# Patient Record
Sex: Female | Born: 1975 | Race: White | Hispanic: No | Marital: Single | Smoking: Never smoker
Health system: Southern US, Community
[De-identification: ages and names within clinical notes are randomized; demographics above are authoritative.]

## PROBLEM LIST (undated history)

## (undated) DIAGNOSIS — D649 Anemia, unspecified: Secondary | ICD-10-CM

## (undated) DIAGNOSIS — F32A Depression, unspecified: Secondary | ICD-10-CM

## (undated) DIAGNOSIS — F329 Major depressive disorder, single episode, unspecified: Secondary | ICD-10-CM

## (undated) HISTORY — PX: WISDOM TOOTH EXTRACTION: SHX21

---

## 2008-07-25 ENCOUNTER — Encounter: Admission: RE | Admit: 2008-07-25 | Discharge: 2008-07-25 | Payer: Self-pay | Admitting: Obstetrics and Gynecology

## 2008-07-31 ENCOUNTER — Encounter: Admission: RE | Admit: 2008-07-31 | Discharge: 2008-07-31 | Payer: Self-pay | Admitting: Obstetrics and Gynecology

## 2009-07-16 IMAGING — MG MM DIAGNOSTIC LTD RIGHT
2 series · 2 of 2 positions shown · non-contrast
Comparison: None

CLINICAL DATA: The patient returns for evaluation of a possible
mass in the right breast noted on recent screening study dated
07/25/2008.  The patient's mother had breast cancer at age 39.

DIGITAL DIAGNOSTIC RIGHT LIMITED MAMMOGRAM ]

[R CC]
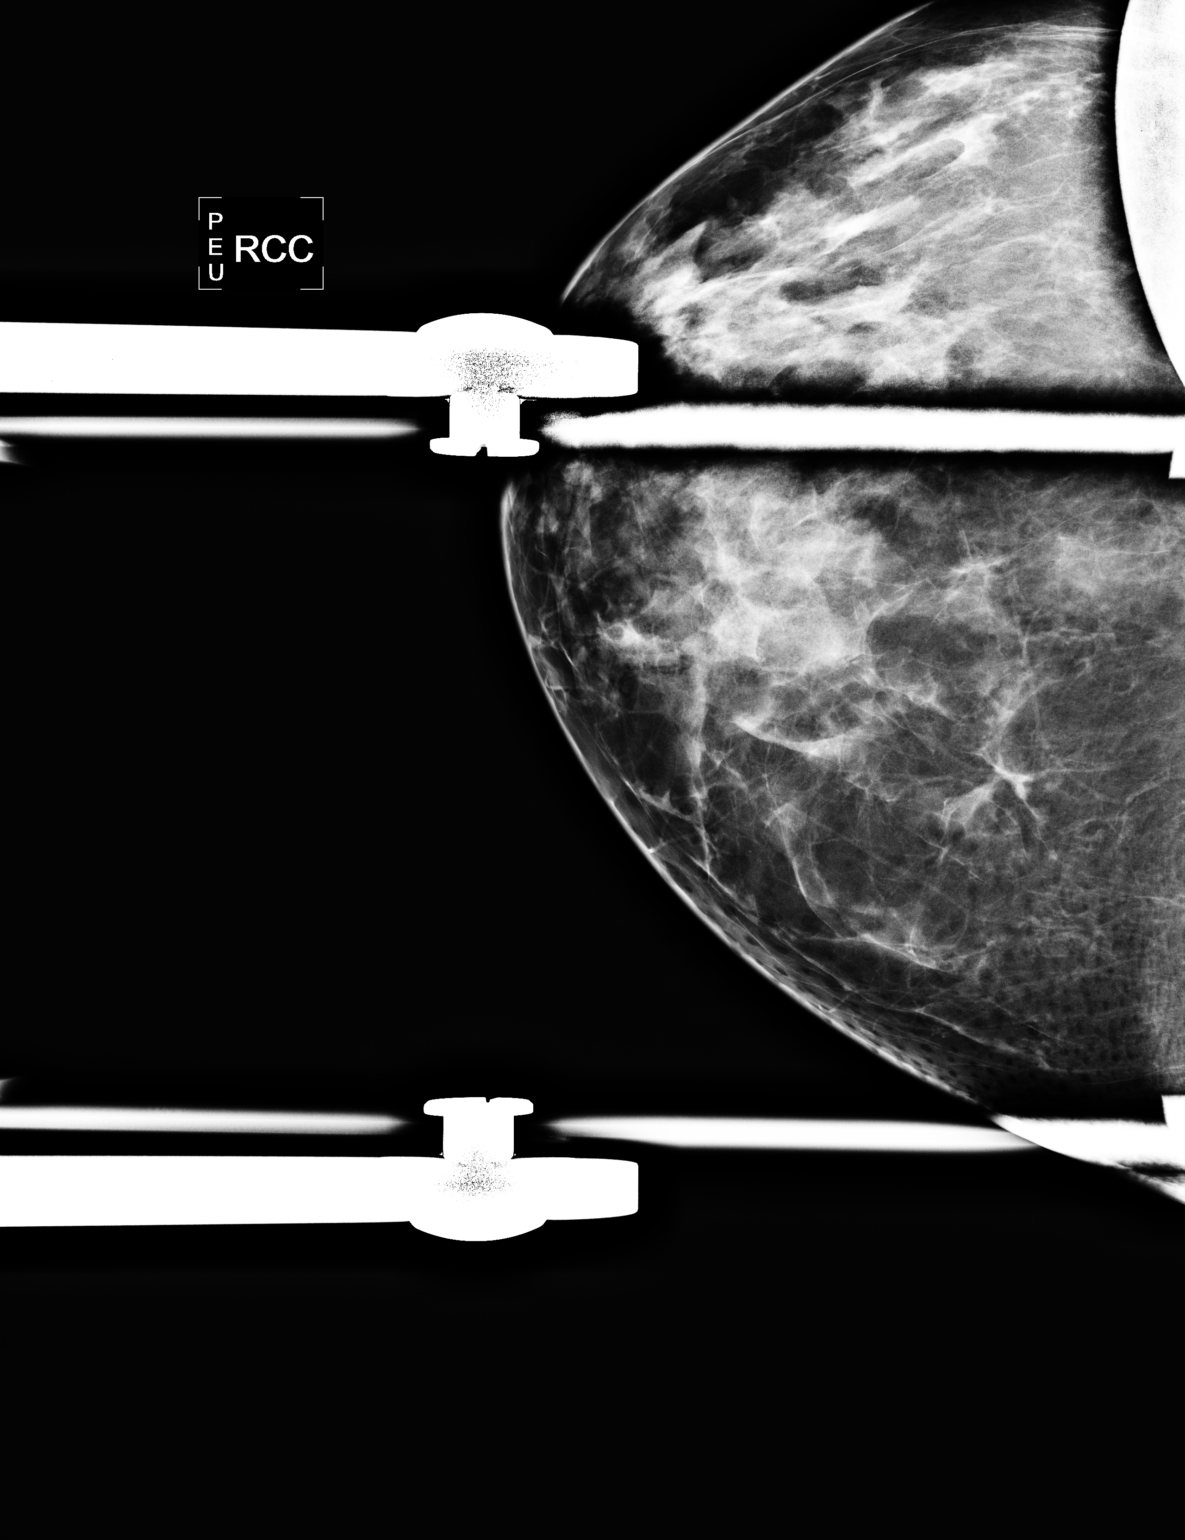

[R MLO]
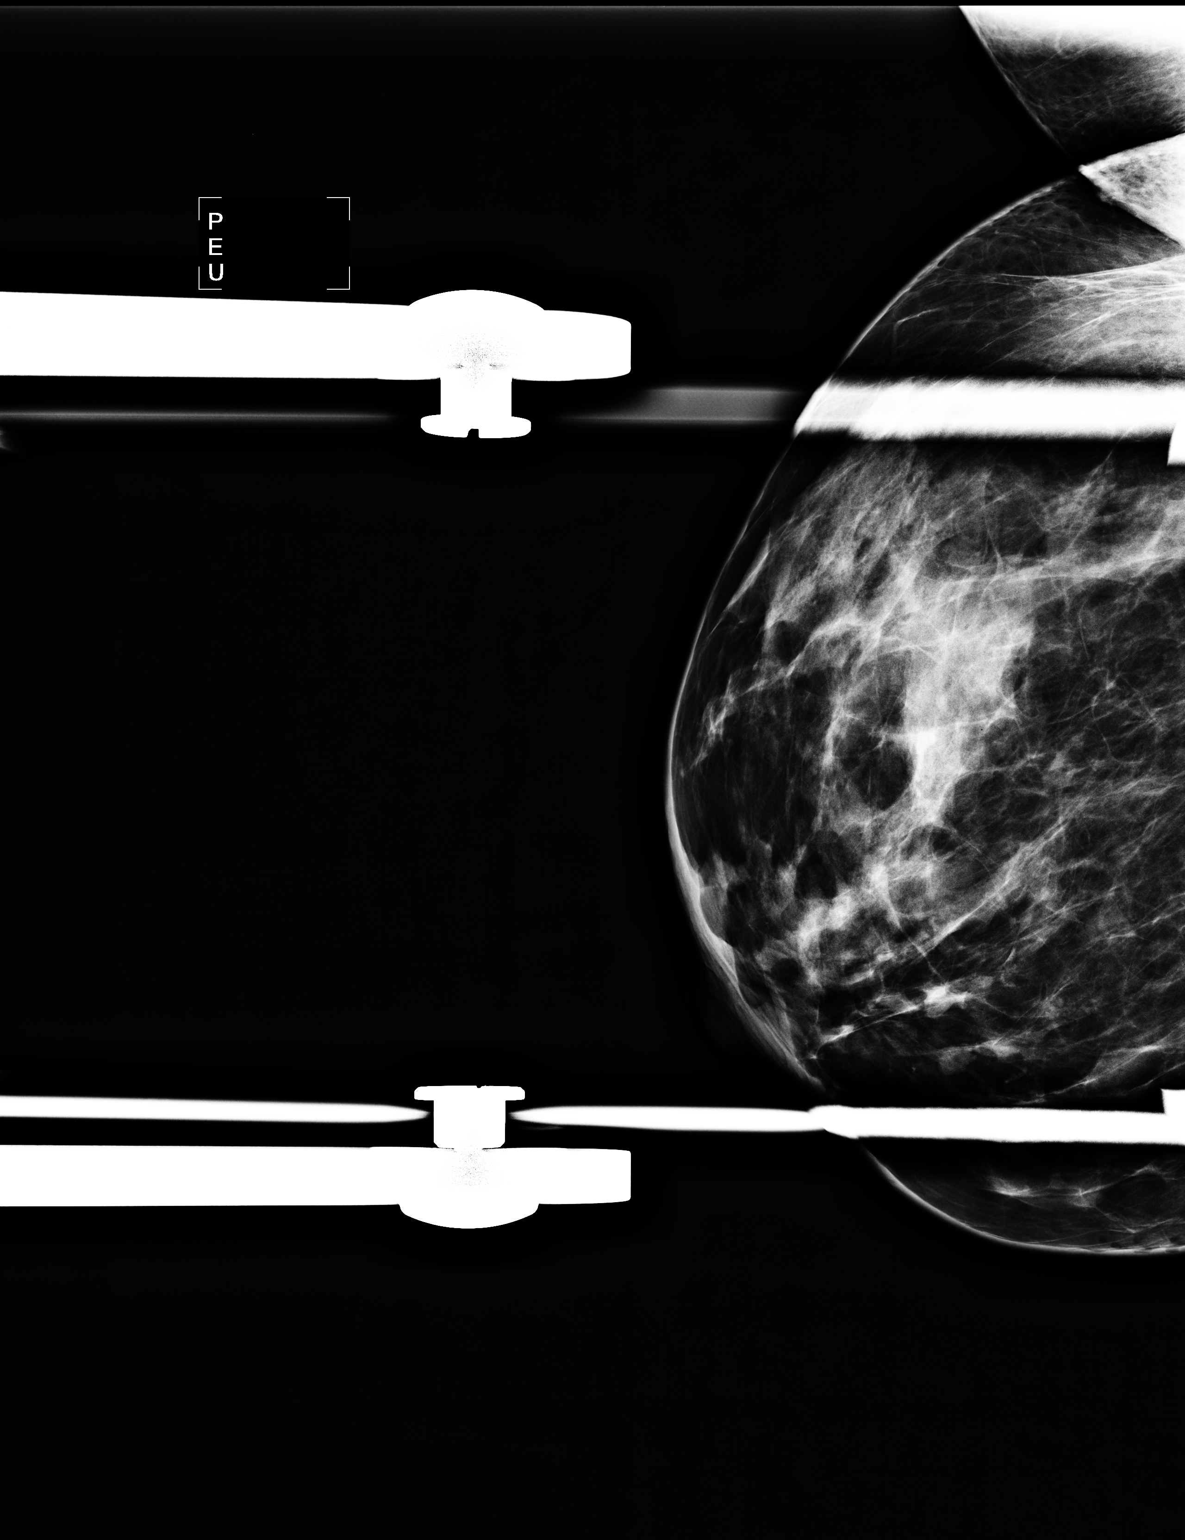

[2 of 2 positions shown; findings below may reference images not displayed]

FINDINGS: Additional views demonstrate no persistent mass or
distortion.
IMPRESSION: No persistent worrisome abnormality upon additional imaging of the
right breast.  Yearly screening mammography is suggested.

BI-RADS CATEGORY 1:  Negative.

## 2012-02-14 ENCOUNTER — Ambulatory Visit (INDEPENDENT_AMBULATORY_CARE_PROVIDER_SITE_OTHER): Payer: BC Managed Care – PPO | Admitting: Family Medicine

## 2012-02-14 VITALS — BP 113/77 | HR 79 | Temp 98.3°F | Resp 18 | Ht 71.5 in | Wt 238.0 lb

## 2012-02-14 DIAGNOSIS — J310 Chronic rhinitis: Secondary | ICD-10-CM

## 2012-02-14 DIAGNOSIS — R059 Cough, unspecified: Secondary | ICD-10-CM

## 2012-02-14 DIAGNOSIS — R05 Cough: Secondary | ICD-10-CM

## 2012-02-14 MED ORDER — BENZONATATE 100 MG PO CAPS: 100.0000 mg | ORAL_CAPSULE | Freq: Two times a day (BID) | ORAL | Status: AC | PRN

## 2012-02-14 MED ORDER — ALBUTEROL SULFATE HFA 108 (90 BASE) MCG/ACT IN AERS: 2.0000 | INHALATION_SPRAY | Freq: Four times a day (QID) | RESPIRATORY_TRACT | Status: AC | PRN

## 2012-02-14 MED ORDER — FLUTICASONE PROPIONATE 50 MCG/ACT NA SUSP: 2.0000 | Freq: Every day | NASAL | Status: AC

## 2012-02-14 NOTE — Progress Notes (Signed)
  [facility]:  Office Visit  Chief Complaint:  Chief Complaint  Patient presents with  . Cough    over a week, hard to exercise, walk up stairs    HPI: Joanna Leonard is a 36 y.o. female who complains of  Cough, non productive, hard to exercise, tried robitussion and nyquil without relief. NO wheeze.  NO asthma or allergies. No ear or sinus pressure. Pressure when she is running. No tobacco use.   History reviewed. No pertinent past medical history. History reviewed. No pertinent past surgical history. History   Social History  . Marital Status: Single    Spouse Name: N/A    Number of Children: N/A  . Years of Education: N/A   Social History Main Topics  . Smoking status: Never Smoker   . Smokeless tobacco: None  . Alcohol Use: No  . Drug Use: No  . Sexually Active: None   Other Topics Concern  . None   Social History Narrative  . None   Family History  Problem Relation Age of Onset  . Diabetes Mother   . Cancer Mother   . Diabetes Father   . Hyperlipidemia Father    Allergies no known allergies Prior to Admission medications   Not on File     ROS: The patient denies fevers, chills, night sweats, unintentional weight loss, chest pain, palpitations, wheezing, dyspnea on exertion, nausea, vomiting, abdominal pain, dysuria, hematuria, melena, numbness, weakness, or tingling. + cough  All other systems have been reviewed and were otherwise negative with the exception of those mentioned in the HPI and as above.    PHYSICAL EXAM: Filed Vitals:   02/14/12 1533  BP: 113/77  Pulse: 79  Temp: 98.3 F (36.8 C)  Resp: 18   Filed Vitals:   02/14/12 1533  Height: 5' 11.5" (1.816 m)  Weight: 238 lb (107.956 kg)   Body mass index is 32.73 kg/(m^2).  General: Alert, no acute distress HEENT:  Normocephalic, atraumatic, oropharynx patent. TM nl,+ red boggy nares, clear rhinorrhea, no sinus tenderenss. NO tonsillar exudates Cardiovascular:  Regular  rate and rhythm, no rubs murmurs or gallops.  No Carotid bruits, radial pulse intact. No pedal edema.  Respiratory: Clear to auscultation bilaterally.  No wheezes, rales, or rhonchi.  No cyanosis, no use of accessory musculature GI: No organomegaly, abdomen is soft and non-tender, positive bowel sounds.  No masses. Skin: No rashes. Neurologic: Facial musculature symmetric. Psychiatric: Patient is appropriate throughout our interaction. Lymphatic: No cervical lymphadenopathy Musculoskeletal: Gait intact.   LABS: No results found for this or any previous visit.   EKG/XRAY:   Primary read interpreted by Dr. Conley Rolls at [facility].   ASSESSMENT/PLAN: Encounter Diagnoses  Name Primary?  . Cough Yes  . Rhinitis     Most likely viral, sxs treatment for now, no Xray today, if sxs worsen ask pt to return for Xray 1. Flonase 2. Tessalon Perles 3. Albuterol INH prn  Recommended getting PCP for annual since there is a h/o breast cancer in family, mom dx at age 78 y/o .     Rockne Coons, DO 02/14/2012 3:47 PM

## 2012-07-20 ENCOUNTER — Other Ambulatory Visit (HOSPITAL_COMMUNITY)
Admission: RE | Admit: 2012-07-20 | Discharge: 2012-07-20 | Disposition: A | Discharge status: Home / Self Care | Payer: BC Managed Care – PPO | Source: Ambulatory Visit | Attending: Family Medicine | Admitting: Family Medicine

## 2012-07-20 DIAGNOSIS — Z124 Encounter for screening for malignant neoplasm of cervix: Secondary | ICD-10-CM | POA: Insufficient documentation

## 2012-07-20 DIAGNOSIS — Z1151 Encounter for screening for human papillomavirus (HPV): Secondary | ICD-10-CM | POA: Insufficient documentation

## 2012-07-21 ENCOUNTER — Other Ambulatory Visit: Payer: Self-pay | Admitting: Family Medicine

## 2012-07-21 DIAGNOSIS — N6323 Unspecified lump in the left breast, lower outer quadrant: Secondary | ICD-10-CM

## 2012-07-21 DIAGNOSIS — Z803 Family history of malignant neoplasm of breast: Secondary | ICD-10-CM

## 2012-07-26 ENCOUNTER — Ambulatory Visit
Admission: RE | Admit: 2012-07-26 | Discharge: 2012-07-26 | Disposition: A | Discharge status: Home / Self Care | Payer: BC Managed Care – PPO | Source: Ambulatory Visit | Attending: Family Medicine | Admitting: Family Medicine

## 2012-07-26 DIAGNOSIS — N6323 Unspecified lump in the left breast, lower outer quadrant: Secondary | ICD-10-CM

## 2012-07-26 DIAGNOSIS — Z803 Family history of malignant neoplasm of breast: Secondary | ICD-10-CM

## 2012-07-27 ENCOUNTER — Other Ambulatory Visit: Payer: Self-pay

## 2014-02-09 ENCOUNTER — Other Ambulatory Visit (HOSPITAL_COMMUNITY)
Admission: RE | Admit: 2014-02-09 | Discharge: 2014-02-09 | Disposition: A | Discharge status: Home / Self Care | Payer: BC Managed Care – PPO | Source: Ambulatory Visit | Attending: Obstetrics and Gynecology | Admitting: Obstetrics and Gynecology

## 2014-02-09 ENCOUNTER — Other Ambulatory Visit: Payer: Self-pay | Admitting: Obstetrics and Gynecology

## 2014-02-09 DIAGNOSIS — Z01419 Encounter for gynecological examination (general) (routine) without abnormal findings: Secondary | ICD-10-CM | POA: Insufficient documentation

## 2014-02-09 DIAGNOSIS — Z1151 Encounter for screening for human papillomavirus (HPV): Secondary | ICD-10-CM | POA: Insufficient documentation

## 2014-02-24 ENCOUNTER — Encounter (HOSPITAL_COMMUNITY): Payer: Self-pay | Admitting: *Deleted

## 2014-03-02 ENCOUNTER — Other Ambulatory Visit: Payer: Self-pay | Admitting: Obstetrics and Gynecology

## 2014-03-02 NOTE — H&P (Signed)
Subjective:  Chief Complaint(s):   Abnormal uterine  bleeding   HPI:  General 38 y/o presents for ultrasound results to evaluate abnormal uterine bleeding. She is not currently bleeding. She had a menses March 6th through march 11th. Prior to that she bled from January 17 until february 16. 1 week was heavy bleeding the other days were light. She is getting married April Eleventh.  Her ultrasound shows a 7.4 cm x 4.4 cm x 4.4 cm uterus. The endometrium is 1.3 cm with a 1.2 cm hyperechoic mass at the fundal wasll measuring 1.2 cm. + BF is noted within the mass . The right ovary has a simple cyst 3.6 cm tha tis avascular. The left ovary is normal.  Current Medication:  Taking  Prenatal Multivit-Iron Tablet as directed occ.     Lo/Ovral (28) 0.3-30 MG-MCG Tablet 1 tablet Once a day, Notes: not taking     Medication List reviewed and reconciled with the patient   Medical History:   2010-2011 h/o depression Did well on Wellbutrin.     h/o iron-deficiency anemia   Allergies/Intolerance:   N.K.D.A.   Gyn History:   Sexual activity not currently sexually active. Periods : regular usually. LMP 12/24/13 until 01/23/14. Birth control none. Last pap smear date 07/2012. Abnormal pap smear none.   OB History:   Never been pregnant per patient. Number of pregnancies 0.   Surgical History:   No Surgical History documented.   Hospitalization:   No Hospitalization History.   Family History:   Father: alive 69 yrs, diabetes, nueropathy, HTN, Sleep Apnea, diagnosed with DM, HTN    Mother: alive 55 yrs, Breast cancer x 2, diabetes, diagnosed with DM, Breast Ca    Brother 1: alive 36 yrs, sleep apnea,gout   Mother diagnosed with breast cancer initially age 18;reoccurance age 3.  Social History:  General Tobacco use cigarettes: Never smoked, Tobacco history last updated 02/09/2014.  no Smoking.  Alcohol: yes, Rare~1x per year.  Caffeine: yes.  no Recreational drug use.  no Diet.  Exercise: yes,  intermittent, does zumba.  Occupation: employed, Runner, broadcasting/film/video - math.  Education: yes, [employer].  Marital Status: single, Divorced/ now engaged - no date set yet.  Children: none.  ROS: Negative except as stated in HPI.   Objective:  Vitals:  Wt 267, Wt change -1 lb, Pulse sitting 66, BP sitting 105/60  Past Results:  Examination:  General Examination alert, oriented, NAD" label="GENERAL APPEARANCE" categoryPropId="[id number]" examid="[id number]"GENERAL APPEARANCE alert, oriented, NAD.  clear to auscultation bilaterally" label="LUNGS:" categoryPropId="87" examid="[id number]"LUNGS: clear to auscultation bilaterally.  regular rate and rhythm" label="HEART:" categoryPropId="86" examid="[id number]"HEART: regular rate and rhythm.  soft, non-tender/non-distended, bowel sounds present" label="ABDOMEN:" categoryPropId="88" examid="[id number]"ABDOMEN: soft, non-tender/non-distended, bowel sounds present.  normal external genitalia, labia - unremarkable, vagina - pink moist mucosa, no lesions or abnormal discharge, cervix - no discharge or lesions or CMT, adnexa - no masses or tenderness, uterus - nontender and normal size on palpation" label="FEMALE GENITOURINARY:" categoryPropId="[id number]" examid="[id number]"FEMALE GENITOURINARY: normal external genitalia, labia - unremarkable, vagina - pink moist mucosa, no lesions or abnormal discharge, cervix - no discharge or lesions or CMT, adnexa - no masses or tenderness, uterus - nontender and normal size on palpation.  no edema present" label="EXTREMITIES:" categoryPropId="89" examid="[id number]"EXTREMITIES: no edema present.  Physical Examination:   Assessment:  Assessment:  Abnormal uterine bleeding - 626.9 (Primary)     Endometrial polyp - 621.0     Plan:  Treatment:  Abnormal uterine bleeding  Imaging:US ECHO TRANSVAGINAL  Notes: u/s results discussed  with the patient..recommend hysteroscopy D&C with possible polypectomy... r/b/a of surgery discussed with patient including but  not limited to infection bleeding uterine perforation with the need for furgery surgery .. pt voiced understanding and is in agreement with proceeding with surgery.  Referral ZO:XWRUTo:Jvion Turgeon OB - Gynecology  Reason:check insurance coverage of hysteroscopy D&C polypectomy  Endometrial polyp  Notes: u/s results discussed with the patient..recommend hysteroscopy D&C with possible polypectomy... r/b/a of surgery discussed with patient including but not limited to infection bleeding uterine perforation with the need for furgery surgery .. pt voiced understanding and is in agreement with proceeding with surgery.  Procedures:  Immunizations:  Therapeutic Injections:  Diagnostic Imaging:  Lab Reports:  Preventive Medicine:    Next Appointment:   3 Weeks (Reason: postoperative visit )

## 2014-03-03 ENCOUNTER — Encounter (HOSPITAL_COMMUNITY): Payer: Self-pay | Admitting: Anesthesiology

## 2014-03-03 ENCOUNTER — Ambulatory Visit (HOSPITAL_COMMUNITY)
Admission: RE | Admit: 2014-03-03 | Discharge: 2014-03-03 | Disposition: A | Discharge status: Home / Self Care | Payer: BC Managed Care – PPO | Source: Ambulatory Visit | Attending: Obstetrics and Gynecology | Admitting: Obstetrics and Gynecology

## 2014-03-03 ENCOUNTER — Ambulatory Visit (HOSPITAL_COMMUNITY): Payer: BC Managed Care – PPO | Admitting: Anesthesiology

## 2014-03-03 ENCOUNTER — Encounter (HOSPITAL_COMMUNITY): Payer: BC Managed Care – PPO | Admitting: Anesthesiology

## 2014-03-03 ENCOUNTER — Encounter (HOSPITAL_COMMUNITY)
Admission: RE | Disposition: A | Discharge status: Home / Self Care | Payer: Self-pay | Source: Ambulatory Visit | Attending: Obstetrics and Gynecology

## 2014-03-03 DIAGNOSIS — N938 Other specified abnormal uterine and vaginal bleeding: Secondary | ICD-10-CM | POA: Insufficient documentation

## 2014-03-03 DIAGNOSIS — N840 Polyp of corpus uteri: Secondary | ICD-10-CM

## 2014-03-03 DIAGNOSIS — Q513 Bicornate uterus: Secondary | ICD-10-CM

## 2014-03-03 DIAGNOSIS — N949 Unspecified condition associated with female genital organs and menstrual cycle: Secondary | ICD-10-CM | POA: Insufficient documentation

## 2014-03-03 HISTORY — PX: HYSTEROSCOPY W/D&C: SHX1775

## 2014-03-03 HISTORY — DX: Major depressive disorder, single episode, unspecified: F32.9

## 2014-03-03 HISTORY — DX: Depression, unspecified: F32.A

## 2014-03-03 HISTORY — DX: Anemia, unspecified: D64.9

## 2014-03-03 LAB — PREGNANCY, URINE: PREG TEST UR: NEGATIVE

## 2014-03-03 LAB — CBC
HCT: 38.9 % (ref 36.0–46.0)
Hemoglobin: 13.1 g/dL (ref 12.0–15.0)
MCH: 28.9 pg (ref 26.0–34.0)
MCHC: 33.7 g/dL (ref 30.0–36.0)
MCV: 85.7 fL (ref 78.0–100.0)
Platelets: 303 10*3/uL (ref 150–400)
RBC: 4.54 MIL/uL (ref 3.87–5.11)
RDW: 13.5 % (ref 11.5–15.5)
WBC: 5.4 10*3/uL (ref 4.0–10.5)

## 2014-03-03 SURGERY — DILATATION AND CURETTAGE /HYSTEROSCOPY
Anesthesia: General | Site: Uterus

## 2014-03-03 MED ORDER — LACTATED RINGERS IV SOLN
INTRAVENOUS | Status: DC
  Administered 2014-03-03 (×2): via INTRAVENOUS

## 2014-03-03 MED ORDER — MIDAZOLAM HCL 2 MG/2ML IJ SOLN
INTRAMUSCULAR | Status: DC | PRN
  Administered 2014-03-03: 2 mg via INTRAVENOUS

## 2014-03-03 MED ORDER — SILVER NITRATE-POT NITRATE 75-25 % EX MISC
CUTANEOUS | Status: DC | PRN
  Administered 2014-03-03: 2

## 2014-03-03 MED ORDER — ONDANSETRON HCL 4 MG/2ML IJ SOLN: 4.0000 mg | Freq: Once | INTRAMUSCULAR | Status: DC | PRN

## 2014-03-03 MED ORDER — FENTANYL CITRATE 0.05 MG/ML IJ SOLN
INTRAMUSCULAR | Status: DC | PRN
  Administered 2014-03-03 (×2): 50 ug via INTRAVENOUS

## 2014-03-03 MED ORDER — PHENYLEPHRINE HCL 10 MG/ML IJ SOLN
INTRAMUSCULAR | Status: AC
  Filled 2014-03-03: qty 1

## 2014-03-03 MED ORDER — SILVER NITRATE-POT NITRATE 75-25 % EX MISC
CUTANEOUS | Status: AC
  Filled 2014-03-03: qty 1

## 2014-03-03 MED ORDER — KETOROLAC TROMETHAMINE 30 MG/ML IJ SOLN
INTRAMUSCULAR | Status: AC
  Filled 2014-03-03: qty 1

## 2014-03-03 MED ORDER — GLYCINE 1.5 % IR SOLN
Status: DC | PRN
  Administered 2014-03-03: 3000 mL

## 2014-03-03 MED ORDER — KETOROLAC TROMETHAMINE 30 MG/ML IJ SOLN: 15.0000 mg | Freq: Once | INTRAMUSCULAR | Status: DC | PRN

## 2014-03-03 MED ORDER — MEPERIDINE HCL 25 MG/ML IJ SOLN: 6.2500 mg | INTRAMUSCULAR | Status: DC | PRN

## 2014-03-03 MED ORDER — FENTANYL CITRATE 0.05 MG/ML IJ SOLN
INTRAMUSCULAR | Status: AC
  Filled 2014-03-03: qty 2

## 2014-03-03 MED ORDER — PROPOFOL 10 MG/ML IV EMUL
INTRAVENOUS | Status: AC
  Filled 2014-03-03: qty 20

## 2014-03-03 MED ORDER — KETOROLAC TROMETHAMINE 30 MG/ML IJ SOLN
INTRAMUSCULAR | Status: DC | PRN
  Administered 2014-03-03: 30 mg via INTRAVENOUS

## 2014-03-03 MED ORDER — DEXAMETHASONE SODIUM PHOSPHATE 10 MG/ML IJ SOLN
INTRAMUSCULAR | Status: AC
  Filled 2014-03-03: qty 1

## 2014-03-03 MED ORDER — FENTANYL CITRATE 0.05 MG/ML IJ SOLN: 25.0000 ug | INTRAMUSCULAR | Status: DC | PRN

## 2014-03-03 MED ORDER — BUPIVACAINE HCL (PF) 0.25 % IJ SOLN
INTRAMUSCULAR | Status: DC | PRN
  Administered 2014-03-03: 20 mL

## 2014-03-03 MED ORDER — MIDAZOLAM HCL 2 MG/2ML IJ SOLN
INTRAMUSCULAR | Status: AC
  Filled 2014-03-03: qty 2

## 2014-03-03 MED ORDER — IBUPROFEN 800 MG PO TABS
800.0000 mg | ORAL_TABLET | Freq: Three times a day (TID) | ORAL | Status: AC | PRN
Start: 2014-03-03 — End: ?

## 2014-03-03 MED ORDER — LIDOCAINE HCL (CARDIAC) 20 MG/ML IV SOLN
INTRAVENOUS | Status: DC | PRN
  Administered 2014-03-03: 80 mg via INTRAVENOUS

## 2014-03-03 MED ORDER — BUPIVACAINE HCL (PF) 0.25 % IJ SOLN
INTRAMUSCULAR | Status: AC
  Filled 2014-03-03: qty 10

## 2014-03-03 MED ORDER — LACTATED RINGERS IV SOLN: INTRAVENOUS | Status: DC

## 2014-03-03 MED ORDER — DEXAMETHASONE SODIUM PHOSPHATE 4 MG/ML IJ SOLN
INTRAMUSCULAR | Status: DC | PRN
  Administered 2014-03-03: 10 mg via INTRAVENOUS

## 2014-03-03 MED ORDER — ONDANSETRON HCL 4 MG/2ML IJ SOLN
INTRAMUSCULAR | Status: DC | PRN
  Administered 2014-03-03: 4 mg via INTRAVENOUS

## 2014-03-03 MED ORDER — LIDOCAINE HCL (CARDIAC) 20 MG/ML IV SOLN
INTRAVENOUS | Status: AC
  Filled 2014-03-03: qty 5

## 2014-03-03 MED ORDER — DOXYCYCLINE HYCLATE 50 MG PO CAPS: 100.0000 mg | ORAL_CAPSULE | Freq: Every day | ORAL | Status: AC

## 2014-03-03 MED ORDER — ONDANSETRON HCL 4 MG/2ML IJ SOLN
INTRAMUSCULAR | Status: AC
  Filled 2014-03-03: qty 2

## 2014-03-03 MED ORDER — PROPOFOL 10 MG/ML IV BOLUS
INTRAVENOUS | Status: DC | PRN
  Administered 2014-03-03: 200 mg via INTRAVENOUS

## 2014-03-03 SURGICAL SUPPLY — 21 items
CANISTER SUCT 3000ML (MISCELLANEOUS) ×3 IMPLANT
CATH ROBINSON RED A/P 16FR (CATHETERS) ×3 IMPLANT
CLOTH BEACON ORANGE TIMEOUT ST (SAFETY) ×3 IMPLANT
CONTAINER PREFILL 10% NBF 60ML (FORM) ×6 IMPLANT
DRAPE HYSTEROSCOPY (DRAPE) ×3 IMPLANT
DRSG TELFA 3X8 NADH (GAUZE/BANDAGES/DRESSINGS) ×3 IMPLANT
ELECT REM PT RETURN 9FT ADLT (ELECTROSURGICAL) ×3
ELECTRODE REM PT RTRN 9FT ADLT (ELECTROSURGICAL) ×1 IMPLANT
GLOVE BIOGEL M 6.5 STRL (GLOVE) ×6 IMPLANT
GLOVE BIOGEL PI IND STRL 6.5 (GLOVE) ×1 IMPLANT
GLOVE BIOGEL PI INDICATOR 6.5 (GLOVE) ×2
GOWN STRL REUS W/TWL LRG LVL3 (GOWN DISPOSABLE) ×6 IMPLANT
LOOP ANGLED CUTTING 22FR (CUTTING LOOP) ×3 IMPLANT
NEEDLE SPNL 22GX3.5 QUINCKE BK (NEEDLE) ×3 IMPLANT
PACK VAGINAL MINOR WOMEN LF (CUSTOM PROCEDURE TRAY) ×3 IMPLANT
PAD OB MATERNITY 4.3X12.25 (PERSONAL CARE ITEMS) ×3 IMPLANT
SET TUBING HYSTEROSCOPY 2 NDL (TUBING) ×3 IMPLANT
SYR CONTROL 10ML LL (SYRINGE) ×3 IMPLANT
TOWEL OR 17X24 6PK STRL BLUE (TOWEL DISPOSABLE) ×6 IMPLANT
TUBE HYSTEROSCOPY W Y-CONNECT (TUBING) IMPLANT
WATER STERILE IRR 1000ML POUR (IV SOLUTION) ×3 IMPLANT

## 2014-03-03 NOTE — H&P (Signed)
Date of Initial H&P: 03/02/2014 History reviewed, patient examined, no change in status, stable for surgery.

## 2014-03-03 NOTE — Anesthesia Postprocedure Evaluation (Signed)
Anesthesia Post Note  Patient: Joanna Leonard  Procedure(s) Performed: Procedure(s) (LRB): DILATATION AND CURETTAGE /HYSTEROSCOPY (N/A)  Anesthesia type: General  Patient location: PACU  Post pain: Pain level controlled  Post assessment: Post-op Vital signs reviewed  Last Vitals:  Filed Vitals:   03/03/14 1345  BP:   Pulse: 54  Temp:   Resp: 20    Post vital signs: Reviewed  Level of consciousness: sedated  Complications: No apparent anesthesia complications

## 2014-03-03 NOTE — Preoperative (Signed)
Beta Blockers   Reason not to administer Beta Blockers:Not Applicable 

## 2014-03-03 NOTE — Anesthesia Preprocedure Evaluation (Signed)
Anesthesia Evaluation  Patient identified by MRN, date of birth, ID band Patient awake    Reviewed: Allergy & Precautions, H&P , NPO status , Patient's Chart, lab work & pertinent test results  Airway Mallampati: II TM Distance: >3 FB Neck ROM: full    Dental no notable dental hx. (+) Teeth Intact   Pulmonary neg pulmonary ROS,    Pulmonary exam normal       Cardiovascular negative cardio ROS      Neuro/Psych negative neurological ROS     GI/Hepatic negative GI ROS, Neg liver ROS,   Endo/Other  negative endocrine ROS  Renal/GU negative Renal ROS     Musculoskeletal   Abdominal (+) + obese,   Peds  Hematology   Anesthesia Other Findings   Reproductive/Obstetrics negative OB ROS                           Anesthesia Physical Anesthesia Plan  ASA: II  Anesthesia Plan: General   Post-op Pain Management:    Induction: Intravenous  Airway Management Planned: LMA  Additional Equipment:   Intra-op Plan:   Post-operative Plan:   Informed Consent: I have reviewed the patients History and Physical, chart, labs and discussed the procedure including the risks, benefits and alternatives for the proposed anesthesia with the patient or authorized representative who has indicated his/her understanding and acceptance.     Plan Discussed with: CRNA and Surgeon  Anesthesia Plan Comments:         Anesthesia Quick Evaluation

## 2014-03-03 NOTE — Transfer of Care (Signed)
Immediate Anesthesia Transfer of Care Note  Patient: Joanna Leonard  Procedure(s) Performed: Procedure(s): DILATATION AND CURETTAGE /HYSTEROSCOPY (N/A)  Patient Location: PACU  Anesthesia Type:General  Level of Consciousness: awake, alert  and oriented  Airway & Oxygen Therapy: Patient Spontanous Breathing and Patient connected to nasal cannula oxygen  Post-op Assessment: Report given to PACU RN, Post -op Vital signs reviewed and stable and Patient moving all extremities  Post vital signs: Reviewed and stable  Complications: No apparent anesthesia complications

## 2014-03-03 NOTE — Discharge Instructions (Addendum)
DISCHARGE INSTRUCTIONS: D&C / HYSTEROSCOPY The following instructions have been prepared to help you care for yourself upon your return home.  **No Ibuprofen containing medications(Aleve, Motrin, Advil) until after 7:04 pm tonight**  Personal hygiene:  Use sanitary pads for vaginal drainage, not tampons.  Shower the day after your procedure.  NO tub baths, pools or Jacuzzis for 2-3 weeks.  Wipe front to back after using the bathroom.  Activity and limitations:  Do NOT drive or operate any equipment for 24 hours. The effects of anesthesia are still present and drowsiness may result.  Do NOT rest in bed all day.  Walking is encouraged.  Walk up and down stairs slowly.  You may resume your normal activity in one to two days or as indicated by your physician.  Sexual activity: NO intercourse for at least 2 weeks after the procedure, or as indicated by your Doctor.  Diet: Eat a light meal as desired this evening. You may resume your usual diet tomorrow.  Return to Work: You may resume your work activities in one to two days or as indicated by Therapist, sportsyour Doctor.  What to expect after your surgery: Expect to have vaginal bleeding/discharge for 2-3 days and spotting for up to 10 days. It is not unusual to have soreness for up to 1-2 weeks. You may have a slight burning sensation when you urinate for the first day. Mild cramps may continue for a couple of days. You may have a regular period in 2-6 weeks.  Call your doctor for any of the following:  Excessive vaginal bleeding or clotting, saturating and changing one pad every hour.  Inability to urinate 6 hours after discharge from hospital.  Pain not relieved by pain medication.  Fever of 100.4 F or greater.  Unusual vaginal discharge or odor.  Return to office _________________Call for an appointment ___________________  Patients signature: ______________________  Nurses signature ________________________  Support  person's signature___________________________  Post Anesthesia Care Unit [phone]

## 2014-03-03 NOTE — Op Note (Signed)
03/03/2014  1:30 PM  PATIENT:  Joanna Leonard  38 y.o. female  PRE-OPERATIVE DIAGNOSIS:  Abnormal Uterine Bleeding  POST-OPERATIVE DIAGNOSIS:  Abnormal Uterine Bleeding  PROCEDURE:  Procedure(s): DILATATION AND CURETTAGE /HYSTEROSCOPY (N/A)  SURGEON:  Surgeon(s) and Role:    * Danyia Borunda J. Richardson Doppole, MD - Primary  PHYSICIAN ASSISTANT:   ASSISTANTS: none   ANESTHESIA:   general  EBL:  Total I/O In: 1500 [I.V.:1500] Out: 70 [Urine:50; Blood:20]  BLOOD ADMINISTERED:none  DRAINS: none   LOCAL MEDICATIONS USED:  MARCAINE     SPECIMEN:  Source of Specimen:  endometrial currettings  and suspected polyp  DISPOSITION OF SPECIMEN:  PATHOLOGY  COUNTS:  YES  TOURNIQUET:  * No tourniquets in log *  DICTATION: .Dragon Dictation  PLAN OF CARE: Discharge to home after PACU  PATIENT DISPOSITION:  PACU - hemodynamically stable.  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 Endometrial polyp in dominate horn... Proliferative endometrium    Delay start of Pharmacological VTE agent (>24hrs) due to surgical blood loss or risk of bleeding: not applicable  Procedure: Patient was taken to the operating room where she was placed under general anesthesia. She was placed in the dorsal lithotomy position. She was prepped and draped in the usual sterile fashion. A speculum was placed into the vaginal vault. The anterior lip of the cervix was grasped with a single-tooth tenaculum. Quarter percent Marcaine was injected at the 4 and 8:00 positions of the cervix. The uterus was then sounded to 8 cm. The cervix was dilated to approximately 4 mm. Diagnostic hysteroscope was inserted. The findings noted above. The hysteroscope was removed. The cervix was dilated to 10 mm and the operative hysteroscope was inserted. The polyp was resected with loop electrode. The resectoscope was removed. A Sharp curet was introduced and endometrial curettings were obtained. The hysteroscope was then reinserted. There was no evidence of  endometrial polyps or masses with reinsertion of the hysteroscope. There was no evidence of perforation. Hysteroscope was then removed. The single-tooth tenaculum was removed from the anterior lip of the cervix.  Bleeding was noted from the tenaculum site.. Silver nitrate was applied.  The speculum was removed from the patient's vagina. She was awakened from anesthesia taken care to the recovery room awake and in stable condition. Sponge lap and needle counts were correct x2.  Glycine deficit was 25cc.

## 2014-03-06 ENCOUNTER — Encounter (HOSPITAL_COMMUNITY): Payer: Self-pay | Admitting: Obstetrics and Gynecology

## 2015-03-06 ENCOUNTER — Other Ambulatory Visit (HOSPITAL_COMMUNITY)
Admission: RE | Admit: 2015-03-06 | Discharge: 2015-03-06 | Disposition: A | Discharge status: Home / Self Care | Payer: BC Managed Care – PPO | Source: Ambulatory Visit | Attending: Obstetrics and Gynecology | Admitting: Obstetrics and Gynecology

## 2015-03-06 ENCOUNTER — Other Ambulatory Visit: Payer: Self-pay | Admitting: Obstetrics and Gynecology

## 2015-03-06 DIAGNOSIS — Z01419 Encounter for gynecological examination (general) (routine) without abnormal findings: Secondary | ICD-10-CM | POA: Insufficient documentation

## 2015-03-08 LAB — CYTOLOGY - PAP

## 2016-04-01 ENCOUNTER — Other Ambulatory Visit: Payer: Self-pay | Admitting: Obstetrics and Gynecology

## 2016-04-01 ENCOUNTER — Other Ambulatory Visit (HOSPITAL_COMMUNITY)
Admission: RE | Admit: 2016-04-01 | Discharge: 2016-04-01 | Disposition: A | Discharge status: Home / Self Care | Payer: BC Managed Care – PPO | Source: Ambulatory Visit | Attending: Obstetrics and Gynecology | Admitting: Obstetrics and Gynecology

## 2016-04-01 DIAGNOSIS — Z01419 Encounter for gynecological examination (general) (routine) without abnormal findings: Secondary | ICD-10-CM | POA: Insufficient documentation

## 2016-04-02 LAB — CYTOLOGY - PAP

## 2017-03-13 ENCOUNTER — Other Ambulatory Visit: Payer: Self-pay | Admitting: Obstetrics and Gynecology

## 2017-03-13 DIAGNOSIS — Z1231 Encounter for screening mammogram for malignant neoplasm of breast: Secondary | ICD-10-CM

## 2017-04-06 ENCOUNTER — Ambulatory Visit: Payer: Self-pay

## 2018-09-01 ENCOUNTER — Other Ambulatory Visit: Payer: Self-pay | Admitting: Obstetrics & Gynecology

## 2018-09-01 DIAGNOSIS — R928 Other abnormal and inconclusive findings on diagnostic imaging of breast: Secondary | ICD-10-CM

## 2018-09-07 ENCOUNTER — Ambulatory Visit
Admission: RE | Admit: 2018-09-07 | Discharge: 2018-09-07 | Disposition: A | Discharge status: Home / Self Care | Payer: BC Managed Care – PPO | Source: Ambulatory Visit | Attending: Obstetrics & Gynecology | Admitting: Obstetrics & Gynecology

## 2018-09-07 ENCOUNTER — Ambulatory Visit: Payer: Self-pay

## 2018-09-07 DIAGNOSIS — R928 Other abnormal and inconclusive findings on diagnostic imaging of breast: Secondary | ICD-10-CM

## 2019-08-23 IMAGING — MG DIGITAL DIAGNOSTIC UNILATERAL LEFT MAMMOGRAM WITH TOMO AND CAD
8 series · 8 of 24 positions shown · non-contrast
Comparison: Previous exam(s).

CLINICAL DATA: Patient recalled from screening for possible left
breast distortion.

EXAM:
DIGITAL DIAGNOSTIC UNILATERAL LEFT MAMMOGRAM WITH CAD AND TOMO

[L CC synth-2D (1 of 2)]
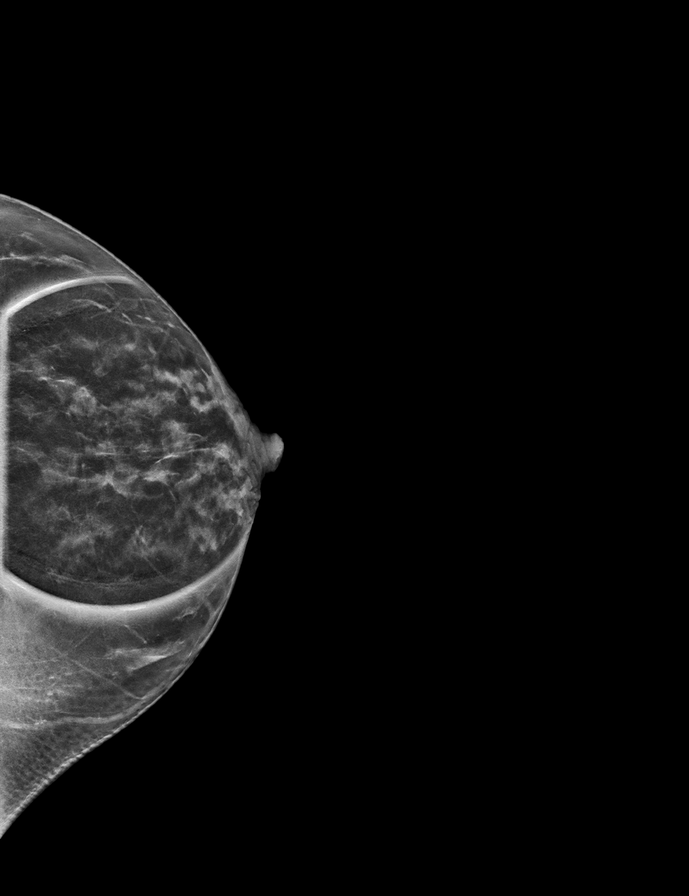

[L CC synth-2D (2 of 2)]
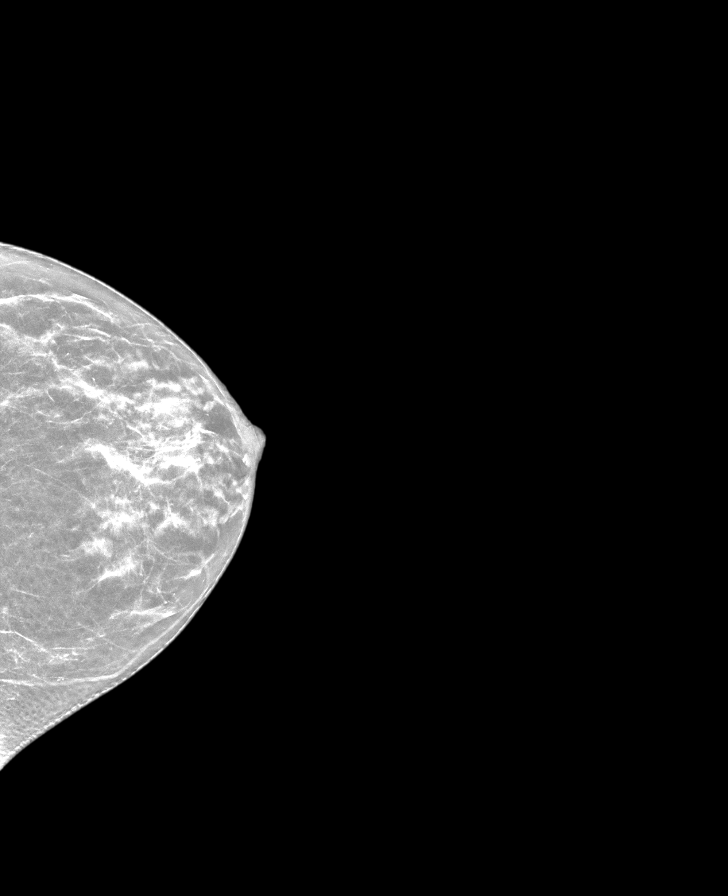

[L MLO synth-2D]
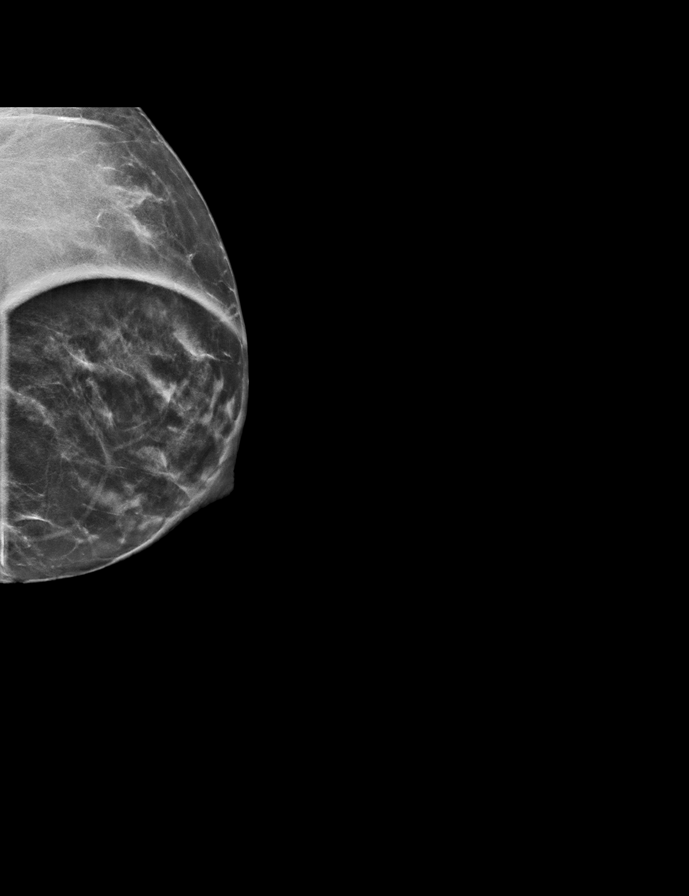

[L ML synth-2D]
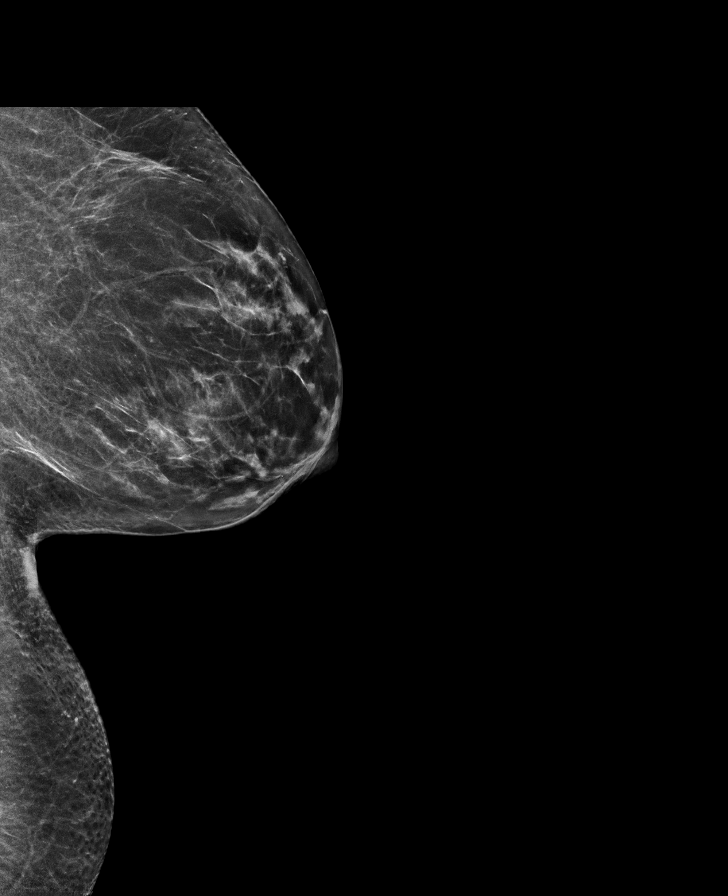

[L MLO tomo · tomo slice 26/51.0]
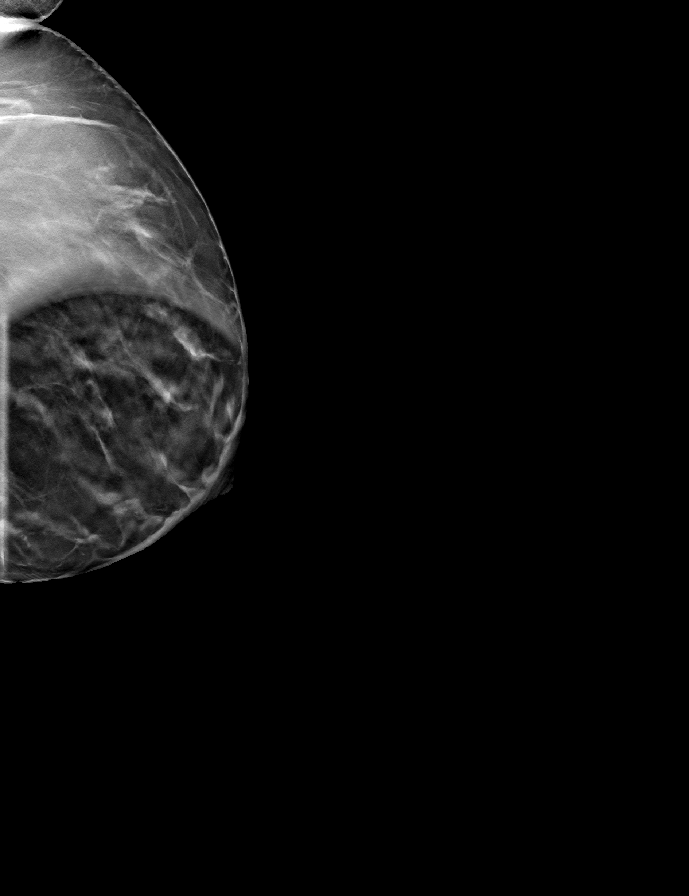

[L ML tomo · tomo slice 33/66.0]
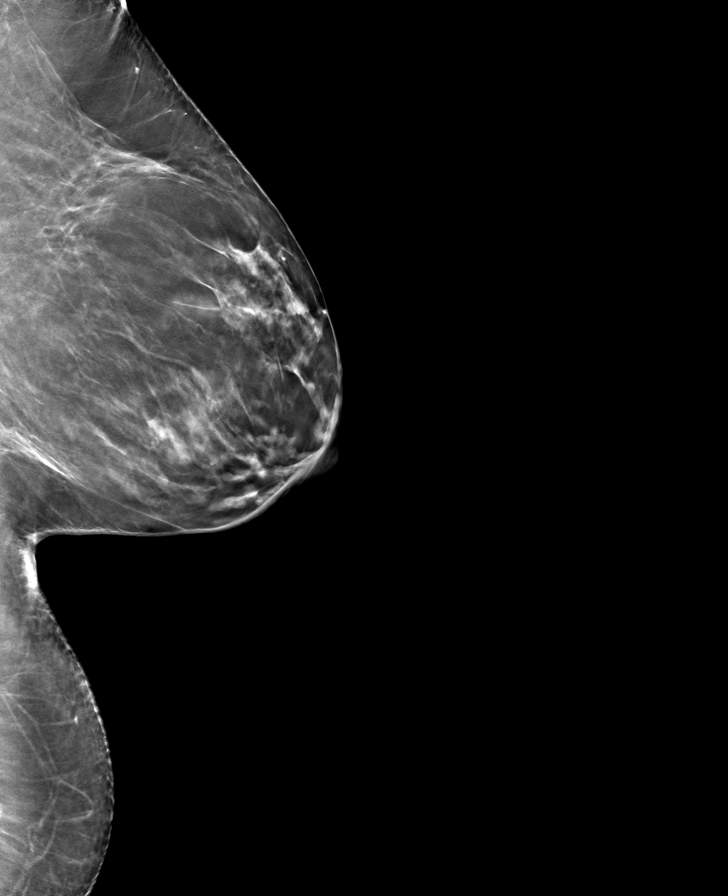

[L CC tomo (1 of 2) · tomo slice 25/50.0]
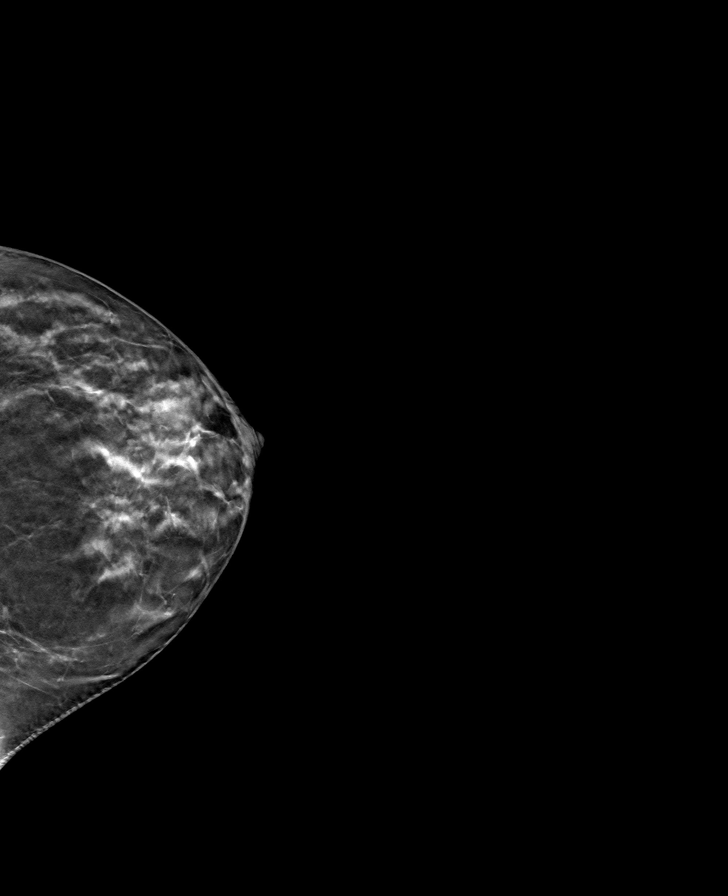

[L CC tomo (2 of 2) · tomo slice 23/46.0]
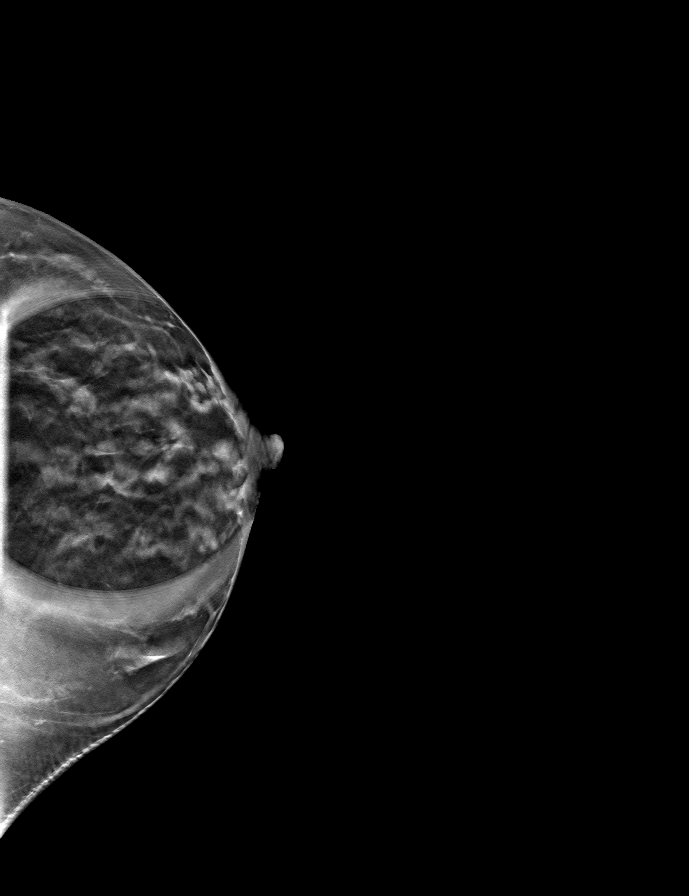

[8 of 24 positions shown; findings below may reference images not displayed]

ACR Breast Density Category c: The breast tissue is heterogeneously
dense, which may obscure small masses.
FINDINGS: Questioned distortion within the left breast resolved with
additional imaging compatible with overlapping fibroglandular
tissue.

Mammographic images were processed with CAD.
IMPRESSION: No mammographic evidence for malignancy.

RECOMMENDATION:
Screening mammogram in one year.(Code:[id number])

I have discussed the findings and recommendations with the patient.
Results were also provided in writing at the conclusion of the
visit. If applicable, a reminder letter will be sent to the patient
regarding the next appointment.

BI-RADS CATEGORY  1: Negative.
